# Patient Record
Sex: Female | Born: 1970 | Race: White | Hispanic: No | Marital: Married | Smoking: Never smoker
Health system: Southern US, Community
[De-identification: ages and names within clinical notes are randomized; demographics above are authoritative.]

## PROBLEM LIST (undated history)

## (undated) DIAGNOSIS — K589 Irritable bowel syndrome without diarrhea: Secondary | ICD-10-CM

## (undated) DIAGNOSIS — K219 Gastro-esophageal reflux disease without esophagitis: Secondary | ICD-10-CM

## (undated) DIAGNOSIS — G43909 Migraine, unspecified, not intractable, without status migrainosus: Secondary | ICD-10-CM

## (undated) DIAGNOSIS — C801 Malignant (primary) neoplasm, unspecified: Secondary | ICD-10-CM

## (undated) HISTORY — PX: CHOLECYSTECTOMY: SHX55

---

## 2008-03-26 ENCOUNTER — Ambulatory Visit: Payer: Self-pay | Admitting: Emergency Medicine

## 2008-08-15 ENCOUNTER — Ambulatory Visit: Payer: Self-pay | Admitting: Internal Medicine

## 2009-10-30 ENCOUNTER — Ambulatory Visit: Payer: Self-pay | Admitting: Internal Medicine

## 2010-10-05 ENCOUNTER — Ambulatory Visit: Payer: Self-pay | Admitting: Internal Medicine

## 2010-11-03 ENCOUNTER — Ambulatory Visit: Payer: Self-pay

## 2011-12-31 ENCOUNTER — Ambulatory Visit: Payer: Self-pay | Admitting: Family Medicine

## 2013-01-31 ENCOUNTER — Ambulatory Visit: Payer: Self-pay | Admitting: Family Medicine

## 2013-03-22 ENCOUNTER — Ambulatory Visit: Payer: Self-pay

## 2013-12-15 ENCOUNTER — Ambulatory Visit: Payer: Self-pay | Admitting: Emergency Medicine

## 2014-08-29 ENCOUNTER — Other Ambulatory Visit: Payer: Self-pay

## 2014-08-29 ENCOUNTER — Encounter: Payer: Self-pay | Admitting: *Deleted

## 2014-08-29 ENCOUNTER — Ambulatory Visit
Admission: EM | Admit: 2014-08-29 | Discharge: 2014-08-29 | Disposition: A | Discharge status: Home / Self Care | Payer: Federal, State, Local not specified - PPO | Attending: Internal Medicine | Admitting: Internal Medicine

## 2014-08-29 DIAGNOSIS — R079 Chest pain, unspecified: Secondary | ICD-10-CM | POA: Diagnosis present

## 2014-08-29 DIAGNOSIS — R0789 Other chest pain: Secondary | ICD-10-CM

## 2014-08-29 DIAGNOSIS — R110 Nausea: Secondary | ICD-10-CM | POA: Diagnosis present

## 2014-08-29 DIAGNOSIS — K219 Gastro-esophageal reflux disease without esophagitis: Secondary | ICD-10-CM | POA: Insufficient documentation

## 2014-08-29 DIAGNOSIS — R42 Dizziness and giddiness: Secondary | ICD-10-CM | POA: Diagnosis present

## 2014-08-29 HISTORY — DX: Gastro-esophageal reflux disease without esophagitis: K21.9

## 2014-08-29 NOTE — Discharge Instructions (Signed)

## 2014-08-29 NOTE — ED Notes (Signed)
Pt states "having chest pressure on the right side of my chest that started about 2-3 days ago, wakes me up at night. Nausea comes and goes, feels better after i eat, I have dizziness with the nausea"

## 2014-08-30 NOTE — ED Provider Notes (Signed)
CSN: [id number]     Arrival date & time 08/29/14  1803 History   First MD Initiated Contact with Patient 08/29/14 1843     Chief Complaint  Patient presents with  . Chest Pain  . Nausea  . Dizziness   HPI   Pt is a 44 yo lady with benign past medical hx who presents with several episodes of nocturnal R upper chest pressure/tightness recently.  No chest pain at urgent care.  Not exertional; improves if able to go walk/run.  No HTN, no DM, no hyperlipidemia.  Never smoker.  Under some stress, sold house and building new one, living in small apartment temporarily with family awaiting new home, for last 6 weeks.  No leg pain/swelling, not taking hormones.    Past Medical History  Diagnosis Date  . GERD (gastroesophageal reflux disease)    History reviewed. No pertinent past surgical history. No family history on file. History  Substance Use Topics  . Smoking status: Never Smoker   . Smokeless tobacco: Not on file  . Alcohol Use: 2.4 oz/week    4 Glasses of wine per week   OB History    No data available     Review of Systems  All other systems reviewed and are negative.   Allergies  Penicillins  Home Medications   Prior to Admission medications   Medication Sig Start Date End Date Taking? Authorizing Provider  omeprazole (PRILOSEC) 20 MG capsule Take 20 mg by mouth daily.   Yes Historical Provider, MD  zolpidem (AMBIEN) 5 MG tablet Take 5 mg by mouth at bedtime as needed for sleep.   Yes Historical Provider, MD   Temp(Src) 98.3 F (36.8 C) (Tympanic)  Ht 5\' 5"  (1.651 m)  Wt 176 lb (79.833 kg)  BMI 29.29 kg/m2 Physical Exam  Constitutional: She is oriented to person, place, and time. No distress.  Alert, nicely groomed  HENT:  Head: Atraumatic.  Eyes:  Conjugate gaze, no eye redness/drainage  Neck: Neck supple.  Cardiovascular: Normal rate and regular rhythm.   Pulmonary/Chest: No respiratory distress.  Lungs clear, symmetric breath sounds  Abdominal: She  exhibits no distension.  Musculoskeletal: Normal range of motion.  No leg swelling  Neurological: She is alert and oriented to person, place, and time.  Skin: Skin is warm and dry.  No cyanosis  Nursing note and vitals reviewed.   ED Course  Procedures   ED ECG REPORT   Date: 08/30/2014  EKG Time: 10:59 PM  Rate:63  Rhythm: normal sinus rhythm,  normal EKG, normal sinus rhythm, unchanged from previous tracings  Axis: 70  Intervals:none  ST&T Change: none  Narrative Interpretation: normal ECG  I reviewed the ECG done in the urgent care, and agree with the computer interpretation.  MDM   1. Atypical chest pain    Has appointment with pcp/Dr Bernadene Person on 7/1.  Followup as planned, to discuss any further evaluation needed.  Might benefit from increasing prilosec to 40mg  qday.  Pattern of pain (non exertional) and absence of significant risk factors suggest benign source of discomfort.  To ED for severe/persistent pain, or exertional pain.    Sherlene Shams, MD 08/30/14 [phone]

## 2014-08-31 ENCOUNTER — Other Ambulatory Visit: Payer: Self-pay | Admitting: Family Medicine

## 2014-08-31 DIAGNOSIS — Z1231 Encounter for screening mammogram for malignant neoplasm of breast: Secondary | ICD-10-CM

## 2014-09-05 ENCOUNTER — Ambulatory Visit
Admission: RE | Admit: 2014-09-05 | Discharge: 2014-09-05 | Disposition: A | Discharge status: Home / Self Care | Payer: Federal, State, Local not specified - PPO | Source: Ambulatory Visit | Attending: Family Medicine | Admitting: Family Medicine

## 2014-09-05 DIAGNOSIS — Z1231 Encounter for screening mammogram for malignant neoplasm of breast: Secondary | ICD-10-CM | POA: Diagnosis not present

## 2014-10-30 ENCOUNTER — Ambulatory Visit
Admission: EM | Admit: 2014-10-30 | Discharge: 2014-10-30 | Disposition: A | Discharge status: Home / Self Care | Payer: Federal, State, Local not specified - PPO | Attending: Family Medicine | Admitting: Family Medicine

## 2014-10-30 ENCOUNTER — Other Ambulatory Visit: Payer: Self-pay

## 2014-10-30 ENCOUNTER — Ambulatory Visit: Payer: Federal, State, Local not specified - PPO

## 2014-10-30 DIAGNOSIS — R079 Chest pain, unspecified: Secondary | ICD-10-CM | POA: Diagnosis present

## 2014-10-30 DIAGNOSIS — R0789 Other chest pain: Secondary | ICD-10-CM | POA: Insufficient documentation

## 2014-10-30 DIAGNOSIS — M549 Dorsalgia, unspecified: Secondary | ICD-10-CM | POA: Diagnosis present

## 2014-10-30 DIAGNOSIS — M791 Myalgia, unspecified site: Secondary | ICD-10-CM

## 2014-10-30 HISTORY — DX: Migraine, unspecified, not intractable, without status migrainosus: G43.909

## 2014-10-30 HISTORY — DX: Irritable bowel syndrome, unspecified: K58.9

## 2014-10-30 LAB — FIBRIN DERIVATIVES D-DIMER (ARMC ONLY): Fibrin derivatives D-dimer (ARMC): 185 (ref 0–499)

## 2014-10-30 LAB — CBC WITH DIFFERENTIAL/PLATELET
BASOS ABS: 0.1 10*3/uL (ref 0–0.1)
BASOS PCT: 1 %
EOS ABS: 0.1 10*3/uL (ref 0–0.7)
Eosinophils Relative: 1 %
HCT: 39.6 % (ref 35.0–47.0)
HEMOGLOBIN: 13.5 g/dL (ref 12.0–16.0)
Lymphocytes Relative: 28 %
Lymphs Abs: 2.1 10*3/uL (ref 1.0–3.6)
MCH: 30.8 pg (ref 26.0–34.0)
MCHC: 34 g/dL (ref 32.0–36.0)
MCV: 90.4 fL (ref 80.0–100.0)
Monocytes Absolute: 0.4 10*3/uL (ref 0.2–0.9)
Monocytes Relative: 6 %
NEUTROS PCT: 64 %
Neutro Abs: 5 10*3/uL (ref 1.4–6.5)
Platelets: 234 10*3/uL (ref 150–440)
RBC: 4.38 MIL/uL (ref 3.80–5.20)
RDW: 12.9 % (ref 11.5–14.5)
WBC: 7.7 10*3/uL (ref 3.6–11.0)

## 2014-10-30 LAB — RAPID STREP SCREEN (MED CTR MEBANE ONLY): Streptococcus, Group A Screen (Direct): NEGATIVE

## 2014-10-30 LAB — COMPREHENSIVE METABOLIC PANEL
ALBUMIN: 4.5 g/dL (ref 3.5–5.0)
ALK PHOS: 57 U/L (ref 38–126)
ALT: 11 U/L — ABNORMAL LOW (ref 14–54)
ANION GAP: 9 (ref 5–15)
AST: 13 U/L — ABNORMAL LOW (ref 15–41)
BUN: 11 mg/dL (ref 6–20)
CALCIUM: 9.5 mg/dL (ref 8.9–10.3)
CO2: 26 mmol/L (ref 22–32)
Chloride: 102 mmol/L (ref 101–111)
Creatinine, Ser: 0.94 mg/dL (ref 0.44–1.00)
GFR calc Af Amer: >60 mL/min (ref >60)
GFR calc non Af Amer: >60 mL/min (ref >60)
GLUCOSE: 94 mg/dL (ref 65–99)
Potassium: 3.7 mmol/L (ref 3.5–5.1)
SODIUM: 137 mmol/L (ref 135–145)
Total Bilirubin: 0.8 mg/dL (ref 0.3–1.2)
Total Protein: 7.6 g/dL (ref 6.5–8.1)

## 2014-10-30 LAB — URINALYSIS COMPLETE WITH MICROSCOPIC (ARMC ONLY)
BACTERIA UA: NONE SEEN — AB
Bilirubin Urine: NEGATIVE
Glucose, UA: NEGATIVE mg/dL
HGB URINE DIPSTICK: NEGATIVE
LEUKOCYTES UA: NEGATIVE
NITRITE: NEGATIVE
PH: 7.5 (ref 5.0–8.0)
PROTEIN: NEGATIVE mg/dL
SPECIFIC GRAVITY, URINE: 1.02 (ref 1.005–1.030)

## 2014-10-30 LAB — TROPONIN I

## 2014-10-30 LAB — CKMB (ARMC ONLY): CK, MB: 0.9 ng/mL (ref 0.5–5.0)

## 2014-10-30 LAB — CK: Total CK: 74 U/L (ref 38–234)

## 2014-10-30 MED ORDER — KETOROLAC TROMETHAMINE 60 MG/2ML IM SOLN
60.0000 mg | Freq: Once | INTRAMUSCULAR | Status: AC
  Administered 2014-10-30: 60 mg via INTRAMUSCULAR

## 2014-10-30 MED ORDER — MELOXICAM 15 MG PO TABS: 15.0000 mg | ORAL_TABLET | Freq: Every day | ORAL | Status: DC

## 2014-10-30 NOTE — Discharge Instructions (Signed)
Chest Pain (Nonspecific) It is often hard to give a diagnosis for the cause of chest pain. There is always a chance that your pain could be related to something serious, such as a heart attack or a blood clot in the lungs. You need to follow up with your doctor. HOME CARE  If antibiotic medicine was given, take it as directed by your doctor. Finish the medicine even if you start to feel better.  For the next few days, avoid activities that bring on chest pain. Continue physical activities as told by your doctor.  Do not use any tobacco products. This includes cigarettes, chewing tobacco, and e-cigarettes.  Avoid drinking alcohol.  Only take medicine as told by your doctor.  Follow your doctor's suggestions for more testing if your chest pain does not go away.  Keep all doctor visits you made. GET HELP IF:  Your chest pain does not go away, even after treatment.  You have a rash with blisters on your chest.  You have a fever. GET HELP RIGHT AWAY IF:   You have more pain or pain that spreads to your arm, neck, jaw, back, or belly (abdomen).  You have shortness of breath.  You cough more than usual or cough up blood.  You have very bad back or belly pain.  You feel sick to your stomach (nauseous) or throw up (vomit).  You have very bad weakness.  You pass out (faint).  You have chills. This is an emergency. Do not wait to see if the problems will go away. Call your local emergency services (911 in [place] Do not drive yourself to the hospital. MAKE SURE YOU:   Understand these instructions.  Will watch your condition.  Will get help right away if you are not doing well or get worse. Document Released: 08/05/2007 Document Revised: 02/21/2013 Document Reviewed: 08/05/2007 [facility] Patient Information 2015 [place], [place]. This information is not intended to replace advice given to you by your health care provider. Make sure you discuss any questions you have with your  health care provider.  Pain of Unknown Etiology (Pain Without a Known Cause) You have come to your caregiver because of pain. Pain can occur in any part of the body. Often there is not a definite cause. If your laboratory (blood or urine) work was normal and X-rays or other studies were normal, your caregiver may treat you without knowing the cause of the pain. An example of this is the headache. Most headaches are diagnosed by taking a history. This means your caregiver asks you questions about your headaches. Your caregiver determines a treatment based on your answers. Usually testing done for headaches is normal. Often testing is not done unless there is no response to medications. Regardless of where your pain is located today, you can be given medications to make you comfortable. If no physical cause of pain can be found, most cases of pain will gradually leave as suddenly as they came.  If you have a painful condition and no reason can be found for the pain, it is important that you follow up with your caregiver. If the pain becomes worse or does not go away, it may be necessary to repeat tests and look further for a possible cause.  Only take over-the-counter or prescription medicines for pain, discomfort, or fever as directed by your caregiver.  For the protection of your privacy, test results cannot be given over the phone. Make sure you receive the results of your test.  Ask how these results are to be obtained if you have not been informed. It is your responsibility to obtain your test results.  You may continue all activities unless the activities cause more pain. When the pain lessens, it is important to gradually resume normal activities. Resume activities by beginning slowly and gradually increasing the intensity and duration of the activities or exercise. During periods of severe pain, bed rest may be helpful. Lie or sit in any position that is comfortable.  Ice used for acute (sudden)  conditions may be effective. Use a large plastic bag filled with ice and wrapped in a towel. This may provide pain relief.  See your caregiver for continued problems. Your caregiver can help or refer you for exercises or physical therapy if necessary. If you were given medications for your condition, do not drive, operate machinery or power tools, or sign legal documents for 24 hours. Do not drink alcohol, take sleeping pills, or take other medications that may interfere with treatment. See your caregiver immediately if you have pain that is becoming worse and not relieved by medications. Document Released: 11/11/2000 Document Revised: 12/07/2012 Document Reviewed: 02/16/2005 [facility] Patient Information 2015 [place], [place]. This information is not intended to replace advice given to you by your health care provider. Make sure you discuss any questions you have with your health care provider.
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
## 2014-10-30 NOTE — ED Provider Notes (Signed)
CSN: [id number]     Arrival date & time 10/30/14  1430 History   First MD Initiated Contact with Patient 10/30/14 1540     Chief Complaint  Patient presents with  . Back Pain   patient's here for back pain but also chest pain. She states that she's had a stress test for the last 2 months because of atypical chest pain. She saw the gastrologist and had endoscopy yesterday she also chiropractor now she has achiness in her chest all over and the back pain. She talked to the GI fellow who want to make sure that she didn't have any cardiac problems going on and she came in to be seen and evaluated. Patient has a quite extensive history of this atypical pain and discomfort in her chest and her back has been undergoing several tests with her PCP gastrologist and other health modalities. Also patient's low-grade fever but no specific or focal complaint. (Consider location/radiation/quality/duration/timing/severity/associated sxs/prior Treatment) Patient is a 44 y.o. female presenting with back pain. The history is provided by the patient and the spouse. No language interpreter was used.  Back Pain Location:  Thoracic spine Quality:  Stiffness Stiffness is present:  All day Radiates to: The chest. Pain severity:  Moderate Onset quality:  Sudden Timing:  Constant Progression:  Unchanged Context: recent illness   Context comment:  Patient had endoscopy yesterday and followed by spinal manipulation of the cervical spine and the thoracic spine Relieved by:  Nothing Worsened by:  Nothing tried Associated symptoms: chest pain and fever   Associated symptoms: no headaches and no numbness   Chest pain:    Quality:  Aching and dull   Severity:  Moderate   Progression:  Worsening   Chronicity:  Recurrent Risk factors: recent surgery   Risk factors: no hx of cancer, no hx of osteoporosis, no lack of exercise, no menopause, not obese and not pregnant   Risk factors comment:  Recently had upper  endoscopy   Past Medical History  Diagnosis Date  . GERD (gastroesophageal reflux disease)   . GERD (gastroesophageal reflux disease)   . Migraines   . IBS (irritable bowel syndrome)    Past Surgical History  Procedure Laterality Date  . Cholecystectomy     mother died of a massive Family History  Problem Relation Age of Onset  . Heart attack Mother    mother actually had a cardiogenic shock after having surgery and going in, her grandmother did have straight coronary artery disease Social History  Substance Use Topics  . Smoking status: Never Smoker   . Smokeless tobacco: None  . Alcohol Use: 2.4 oz/week    4 Glasses of wine per week     Comment: socially   OB History    No data available     Review of Systems  Constitutional: Positive for fever.  HENT: Positive for congestion, rhinorrhea and sore throat.   Cardiovascular: Positive for chest pain.  Genitourinary: Negative.   Musculoskeletal: Positive for myalgias and back pain.  Skin: Negative.   Neurological: Negative for speech difficulty, numbness and headaches.  All other systems reviewed and are negative.   Allergies  Imitrex and Penicillins  Home Medications   Prior to Admission medications   Medication Sig Start Date End Date Taking? Authorizing Provider  cyclobenzaprine (FLEXERIL) 10 MG tablet Take 10 mg by mouth 3 (three) times daily as needed for muscle spasms.   Yes Historical Provider, MD  desipramine (NORPRAMIN) 50 MG tablet Take 50 mg  by mouth daily.   Yes Historical Provider, MD  pantoprazole (PROTONIX) 20 MG tablet Take 20 mg by mouth 2 (two) times daily.   Yes Historical Provider, MD  verapamil (COVERA HS) 180 MG (CO) 24 hr tablet Take 180 mg by mouth at bedtime.   Yes Historical Provider, MD  zolpidem (AMBIEN) 5 MG tablet Take 5 mg by mouth at bedtime as needed for sleep.   Yes Historical Provider, MD  meloxicam (MOBIC) 15 MG tablet Take 1 tablet (15 mg total) by mouth daily. 10/30/14   Frederich Cha, MD  omeprazole (PRILOSEC) 20 MG capsule Take 20 mg by mouth daily.    Historical Provider, MD   Meds Ordered and Administered this Visit   Medications  ketorolac (TORADOL) injection 60 mg (60 mg Intramuscular Given 10/30/14 1615)    BP 123/75 mmHg  Pulse 66  Temp(Src) 98.6 F (37 C) (Tympanic)  Resp 16  SpO2 100% No data found.   Physical Exam  Constitutional: She is oriented to person, place, and time. She appears well-developed and well-nourished. No distress.  HENT:  Head: Normocephalic and atraumatic.  Right Ear: External ear normal.  Eyes: Conjunctivae are normal. Pupils are equal, round, and reactive to light.  Neck: Normal range of motion. Neck supple. No tracheal deviation present. No thyromegaly present.  Cardiovascular: Normal rate, regular rhythm, S1 normal, S2 normal and normal heart sounds.   Pulmonary/Chest: Effort normal and breath sounds normal. No accessory muscle usage. No respiratory distress. She has no decreased breath sounds. She has no wheezes. Chest wall is not dull to percussion. She exhibits no tenderness, no bony tenderness and no deformity.  Abdominal: Soft. Bowel sounds are normal.  Musculoskeletal: Normal range of motion. She exhibits no edema or tenderness.  Neurological: She is alert and oriented to person, place, and time. No cranial nerve deficit.  Skin: Skin is warm and dry. She is not diaphoretic. No erythema.  Psychiatric: She has a normal mood and affect. Her behavior is normal.  Vitals reviewed.   ED Course  Procedures (including critical care time)  Labs Review Labs Reviewed  COMPREHENSIVE METABOLIC PANEL - Abnormal; Notable for the following:    AST 13 (*)    ALT 11 (*)    All other components within normal limits  URINALYSIS COMPLETEWITH MICROSCOPIC ([facility] ONLY) - Abnormal; Notable for the following:    APPearance HAZY (*)    Ketones, ur 1+ (*)    Bacteria, UA NONE SEEN (*)    Squamous Epithelial / LPF 0-5 (*)    All  other components within normal limits  RAPID STREP SCREEN (NOT AT [facility])  CULTURE, GROUP A STREP ([facility] ONLY)  CBC WITH DIFFERENTIAL/PLATELET  TROPONIN I  CK  FIBRIN DERIVATIVES D-DIMER ([facility] ONLY)  CKMB([facility] ONLY)    Imaging Review Dg Chest 2 View  10/30/2014   CLINICAL DATA:  Centralized chest pain for 2 months. Worsening today. Endoscopy yesterday.  EXAM: CHEST  2 VIEW  COMPARISON:  10/05/2010  FINDINGS: The heart size and mediastinal contours are within normal limits. Both lungs are clear. The visualized skeletal structures are unremarkable.  IMPRESSION: No active cardiopulmonary disease.   Electronically Signed   By: Rolm Baptise M.D.   On: 10/30/2014 15:33     Visual Acuity Review  Right Eye Distance:   Left Eye Distance:   Bilateral Distance:    Right Eye Near:   Left Eye Near:    Bilateral Near:     EKG showed normal  sinus rhythm.    MDM   1. Atypical chest pain   2. Myalgia    Explained earlier in my main goal was make sure there was nothing life-threatening. In concern because of the mild elevated fever but this all can be viral. Lab work since she came back negative will haziness the urine but no bacteria and no leukocytes were seen. Apparently Toradol injection didn't help much. Will give sufficient for Mobic 7. with her doctor later this week, and follow-up with her PCP.Marland Kitchen THE lab work x-rays are given to them as well. Husband expresses concern that they need to head home so will allow nurse discharge patient with a prescription for Mobic.   Frederich Cha, MD 10/30/14 [phone]

## 2014-11-02 LAB — CULTURE, GROUP A STREP (THRC)

## 2014-11-06 ENCOUNTER — Other Ambulatory Visit: Payer: Self-pay | Admitting: Family Medicine

## 2014-11-06 DIAGNOSIS — R0602 Shortness of breath: Secondary | ICD-10-CM

## 2014-11-06 DIAGNOSIS — R0789 Other chest pain: Secondary | ICD-10-CM

## 2014-11-09 ENCOUNTER — Ambulatory Visit
Admission: RE | Admit: 2014-11-09 | Discharge: 2014-11-09 | Disposition: A | Discharge status: Home / Self Care | Payer: Federal, State, Local not specified - PPO | Source: Ambulatory Visit | Attending: Family Medicine | Admitting: Family Medicine

## 2014-11-09 DIAGNOSIS — R0789 Other chest pain: Secondary | ICD-10-CM | POA: Diagnosis present

## 2014-11-09 DIAGNOSIS — R0602 Shortness of breath: Secondary | ICD-10-CM | POA: Insufficient documentation

## 2014-11-09 MED ORDER — IOHEXOL 350 MG/ML SOLN
100.0000 mL | Freq: Once | INTRAVENOUS | Status: AC | PRN
  Administered 2014-11-09: 100 mL via INTRAVENOUS

## 2015-09-06 ENCOUNTER — Ambulatory Visit
Admission: RE | Admit: 2015-09-06 | Discharge: 2015-09-06 | Disposition: A | Discharge status: Home / Self Care | Payer: Federal, State, Local not specified - PPO | Source: Ambulatory Visit | Attending: Family Medicine | Admitting: Family Medicine

## 2015-09-06 ENCOUNTER — Other Ambulatory Visit: Payer: Self-pay | Admitting: Family Medicine

## 2015-09-06 DIAGNOSIS — R52 Pain, unspecified: Secondary | ICD-10-CM

## 2015-09-06 DIAGNOSIS — M79672 Pain in left foot: Secondary | ICD-10-CM | POA: Diagnosis present

## 2015-10-22 ENCOUNTER — Other Ambulatory Visit: Payer: Self-pay | Admitting: Family Medicine

## 2015-10-22 DIAGNOSIS — Z1231 Encounter for screening mammogram for malignant neoplasm of breast: Secondary | ICD-10-CM

## 2015-11-19 ENCOUNTER — Other Ambulatory Visit: Payer: Self-pay | Admitting: Family Medicine

## 2015-11-19 ENCOUNTER — Ambulatory Visit
Admission: RE | Admit: 2015-11-19 | Discharge: 2015-11-19 | Disposition: A | Discharge status: Home / Self Care | Payer: Federal, State, Local not specified - PPO | Source: Ambulatory Visit | Attending: Family Medicine | Admitting: Family Medicine

## 2015-11-19 DIAGNOSIS — Z1231 Encounter for screening mammogram for malignant neoplasm of breast: Secondary | ICD-10-CM | POA: Insufficient documentation

## 2016-03-11 ENCOUNTER — Other Ambulatory Visit: Payer: Self-pay | Admitting: Internal Medicine

## 2016-03-11 ENCOUNTER — Ambulatory Visit
Admission: RE | Admit: 2016-03-11 | Discharge: 2016-03-11 | Disposition: A | Discharge status: Home / Self Care | Payer: Self-pay | Source: Ambulatory Visit | Attending: Internal Medicine | Admitting: Internal Medicine

## 2016-03-11 DIAGNOSIS — M542 Cervicalgia: Secondary | ICD-10-CM

## 2016-03-11 DIAGNOSIS — M47892 Other spondylosis, cervical region: Secondary | ICD-10-CM | POA: Insufficient documentation

## 2017-01-06 ENCOUNTER — Other Ambulatory Visit: Payer: Self-pay | Admitting: Medical Oncology

## 2017-01-06 ENCOUNTER — Ambulatory Visit
Admission: RE | Admit: 2017-01-06 | Discharge: 2017-01-06 | Disposition: A | Discharge status: Home / Self Care | Payer: Federal, State, Local not specified - PPO | Source: Ambulatory Visit | Attending: Medical Oncology | Admitting: Medical Oncology

## 2017-01-06 DIAGNOSIS — M79661 Pain in right lower leg: Secondary | ICD-10-CM | POA: Diagnosis present

## 2017-05-29 ENCOUNTER — Other Ambulatory Visit: Payer: Self-pay

## 2017-05-29 ENCOUNTER — Ambulatory Visit
Admission: EM | Admit: 2017-05-29 | Discharge: 2017-05-29 | Disposition: A | Discharge status: Home / Self Care | Payer: Federal, State, Local not specified - PPO | Attending: Emergency Medicine | Admitting: Emergency Medicine

## 2017-05-29 DIAGNOSIS — R197 Diarrhea, unspecified: Secondary | ICD-10-CM

## 2017-05-29 DIAGNOSIS — K2900 Acute gastritis without bleeding: Secondary | ICD-10-CM

## 2017-05-29 DIAGNOSIS — R110 Nausea: Secondary | ICD-10-CM

## 2017-05-29 MED ORDER — ONDANSETRON 8 MG PO TBDP: 8.0000 mg | ORAL_TABLET | Freq: Two times a day (BID) | ORAL | 0 refills | Status: AC

## 2017-05-29 NOTE — ED Triage Notes (Signed)
Patient complains of abdominal pain in upper abdomen with nausea. Patient states that she has been seeing a GI Specialist for this. Patient reports that symptoms worsened last night, with nausea getting stronger.

## 2017-05-29 NOTE — ED Provider Notes (Signed)
[facility]    CSN: [id number] Arrival date & time: 05/29/17  1232     History   Chief Complaint Chief Complaint  Patient presents with  . Abdominal Pain    HPI Joanne Bender is a 47 y.o. female.   HPI  47 year old female presents with abdominal pain which indicates left lower quadrant upper quadrant nausea but no vomiting.  She has had diarrhea that has been runny but without blood or mucus.  She has been seeing a GI specialist for possible autoimmune issues causing her stomach problems.  She does have a history of IBS.  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  States that last night was the worst she has had in a while with pain in the abdomen nausea but no vomiting.  She has lost her appetite.  She has eaten only lactose-free milk bananas and rice.  Had no fever or chills.         Past Medical History:  Diagnosis Date  . GERD (gastroesophageal reflux disease)   . GERD (gastroesophageal reflux disease)   . IBS (irritable bowel syndrome)   . Migraines     There are no active problems to display for this patient.   Past Surgical History:  Procedure Laterality Date  . CHOLECYSTECTOMY      OB History   None      Home Medications    Prior to Admission medications   Medication Sig Start Date End Date Taking? Authorizing Provider  Ascorbic Acid (VITAMIN C) 1000 MG tablet Take by mouth.   Yes [provider]  azelastine (ASTELIN) 0.1 % nasal spray Place into the nose. 05/21/17  Yes [provider]  Cholecalciferol (VITAMIN D) 2000 units tablet Take by mouth.   Yes [provider]  Coenzyme Q10 (CO Q 10) 10 MG CAPS Take by mouth.   Yes [provider]  diazepam (VALIUM) 5 MG tablet diazepam 5 mg tablet  TAKE 1 TABLET BY MOUTH EVERY DAY AT NIGHT 05/21/17  Yes [provider]  Diclofenac Sodium (PENNSAID) 2 %  SOLN Pennsaid 20 mg/gram/actuation (2 %) topical soln in metered-dose pump  APPLY 2 PUMPS (40 MG) TO THE AFFECTED KNEE(S) BY TOPICAL ROUTE 2 TIMES PER DAY   Yes [provider]  DOCOSAHEXAENOIC ACID PO Take by mouth.   Yes [provider]  fluticasone (FLONASE) 50 MCG/ACT nasal spray daily as needed. Frequency:PHARMDIR   Dosage:50   MCG  Instructions:  Note:Two puffs in each nostril once a day for seasonal allergies. Dose: 50MCG 06/19/11  Yes [provider]  folic acid (FOLVITE) 970 MCG tablet Take by mouth. 04/09/17 04/09/18 Yes [provider]  gabapentin (NEURONTIN) 100 MG capsule gabapentin 100 mg capsule  TAKE 1-3 CAPSULES BY MOUTH THREE TIMES DAILY   Yes [provider]  HYDROcodone-acetaminophen (NORCO) 5-325 MG tablet Norco 5 mg-325 mg tablet  Take 1 tablet 3 times a day by oral route as needed for 30 days.   Yes [provider]  levonorgestrel (MIRENA, 52 MG,) 20 MCG/24HR IUD by Intrauterine route.   Yes [provider]  Magnesium 200 MG TABS Use 1,250 mg 3 (three) times daily. Reported on 02/13/2015   Yes [provider]  naloxone (NARCAN) nasal spray 4 mg/0.1 mL Narcan 4 mg/actuation nasal spray   Yes [provider]  pimecrolimus (ELIDEL) 1 % cream 1  APPLICATION APPLY ON THE SKIN AT BEDTIME PRN 03/10/16  Yes [provider]  ranitidine (ZANTAC) 150 MG tablet  05/27/17  Yes [provider]  sulfaSALAzine (AZULFIDINE) 500 MG EC tablet sulfasalazine 500 mg tablet,delayed release  TAKE 2 TABLETS BY MOUTH TWICE A DAY   Yes [provider]  traZODone (DESYREL) 50 MG tablet Take by mouth. 05/21/17  Yes [provider]  ondansetron (ZOFRAN ODT) 8 MG disintegrating tablet Take 1 tablet (8 mg total) by mouth 2 (two) times daily. 05/29/17   Lorin Picket, PA-C    Family History Family History  Problem Relation Age of Onset  . Heart attack Mother     Social History Social History    Tobacco Use  . Smoking status: Never Smoker  . Smokeless tobacco: Never Used  Substance Use Topics  . Alcohol use: Not Currently  . Drug use: No     Allergies   Beta vulgaris and Imitrex [sumatriptan]   Review of Systems Review of Systems   Physical Exam Triage Vital Signs ED Triage Vitals  Enc Vitals Group     BP 05/29/17 1256 116/69     Pulse Rate 05/29/17 1256 87     Resp 05/29/17 1256 18     Temp 05/29/17 1256 98 F (36.7 C)     Temp Source 05/29/17 1256 Oral     SpO2 05/29/17 1256 100 %     Weight 05/29/17 1249 165 lb (74.8 kg)     Height 05/29/17 1249 5\' 5"  (1.651 m)     Head Circumference --      Peak Flow --      Pain Score 05/29/17 1248 6     Pain Loc --      Pain Edu? --      Excl. in [place]? --    No data found.  Updated Vital Signs BP 116/69 (BP Location: Right Arm)   Pulse 87   Temp 98 F (36.7 C) (Oral)   Resp 18   Ht 5\' 5"  (1.651 m)   Wt 165 lb (74.8 kg)   SpO2 100%   BMI 27.46 kg/m   Visual Acuity Right Eye Distance:   Left Eye Distance:   Bilateral Distance:    Right Eye Near:   Left Eye Near:    Bilateral Near:     Physical Exam   UC Treatments / Results  Labs (all labs ordered are listed, but only abnormal results are displayed) Labs Reviewed - No data to display  EKG None Radiology No results found.  Procedures Procedures (including critical care time)  Medications Ordered in UC Medications - No data to display   Initial Impression / Assessment and Plan / UC Course  I have reviewed the triage vital signs and the nursing notes.  Pertinent labs & imaging results that were available during my care of the patient were reviewed by me and considered in my medical decision making (see chart for details).     Plan: 1. Test/x-ray results and diagnosis reviewed with patient 2. rx as per orders; risks, benefits, potential side effects reviewed with patient 3. Recommend supportive treatment with hydration.  Assured that  the physical exam does not show an acute abdomen.  Because of the amount of GI- type flu in the area that we have been seeing, this is likely what is going on; but her other problems of auto immune disease,with the onset of this viral gastroenteritis,  if it  Worsens, with increasing  abdominal pain vomiting that is intractable,etc., have recommended that she goes immediately to the emergency room.  She does improve then I recommend she follow-up with her primary care physician next week. 4. F/u prn if symptoms worsen or don't improve   Final Clinical Impressions(s) / UC Diagnoses   Final diagnoses:  Acute gastritis without hemorrhage, unspecified gastritis type  Nausea  Diarrhea of presumed infectious origin    ED Discharge Orders        Ordered    ondansetron (ZOFRAN ODT) 8 MG disintegrating tablet  2 times daily     05/29/17 1354       Controlled Substance Prescriptions [place] Controlled Substance Registry consulted? Not Applicable   Lorin Picket, PA-C 05/29/17 1406

## 2017-05-29 NOTE — Discharge Instructions (Signed)
Keep well-hydrated.  If your abdominal pain worsens or you have bloody diarrhea or any significant change in your symptoms recommend going to the emergency room

## 2017-11-09 ENCOUNTER — Other Ambulatory Visit: Payer: Self-pay | Admitting: Family Medicine

## 2017-11-09 DIAGNOSIS — Z1231 Encounter for screening mammogram for malignant neoplasm of breast: Secondary | ICD-10-CM

## 2017-11-18 ENCOUNTER — Ambulatory Visit
Admission: RE | Admit: 2017-11-18 | Discharge: 2017-11-18 | Disposition: A | Discharge status: Home / Self Care | Payer: Federal, State, Local not specified - PPO | Source: Ambulatory Visit | Attending: Family Medicine | Admitting: Family Medicine

## 2017-11-18 ENCOUNTER — Encounter (INDEPENDENT_AMBULATORY_CARE_PROVIDER_SITE_OTHER): Payer: Self-pay

## 2017-11-18 DIAGNOSIS — Z1231 Encounter for screening mammogram for malignant neoplasm of breast: Secondary | ICD-10-CM | POA: Insufficient documentation
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
## 2019-07-18 ENCOUNTER — Other Ambulatory Visit: Payer: Self-pay | Admitting: Family Medicine

## 2019-07-18 DIAGNOSIS — Z1231 Encounter for screening mammogram for malignant neoplasm of breast: Secondary | ICD-10-CM

## 2019-08-01 ENCOUNTER — Other Ambulatory Visit: Payer: Self-pay

## 2019-08-01 ENCOUNTER — Ambulatory Visit
Admission: RE | Admit: 2019-08-01 | Discharge: 2019-08-01 | Disposition: A | Discharge status: Home / Self Care | Payer: Federal, State, Local not specified - PPO | Source: Ambulatory Visit | Attending: Family Medicine | Admitting: Family Medicine

## 2019-08-01 DIAGNOSIS — Z1231 Encounter for screening mammogram for malignant neoplasm of breast: Secondary | ICD-10-CM | POA: Diagnosis present

## 2019-08-01 HISTORY — DX: Malignant (primary) neoplasm, unspecified: C80.1

## 2019-08-06 ENCOUNTER — Ambulatory Visit
Admission: EM | Admit: 2019-08-06 | Discharge: 2019-08-06 | Disposition: A | Discharge status: Home / Self Care | Payer: Federal, State, Local not specified - PPO | Attending: Family Medicine | Admitting: Family Medicine

## 2019-08-06 ENCOUNTER — Encounter: Payer: Self-pay | Admitting: Emergency Medicine

## 2019-08-06 ENCOUNTER — Other Ambulatory Visit: Payer: Self-pay

## 2019-08-06 DIAGNOSIS — M459 Ankylosing spondylitis of unspecified sites in spine: Secondary | ICD-10-CM | POA: Diagnosis present

## 2019-08-06 DIAGNOSIS — R0789 Other chest pain: Secondary | ICD-10-CM | POA: Diagnosis present

## 2019-08-06 LAB — TROPONIN I (HIGH SENSITIVITY): Troponin I (High Sensitivity): <2 ng/L (ref <18)

## 2019-08-06 NOTE — ED Triage Notes (Signed)
Patient states that she has chronic back pain.  Patient states that her chest pain has increased in its occurrence.  Patient reports some SOB.

## 2019-08-06 NOTE — ED Provider Notes (Signed)
[facility]    CSN: [id number] Arrival date & time: 08/06/19  1111      History   Chief Complaint Chief Complaint  Patient presents with  . Chest Pain    HPI Joanne Bender is a 49 y.o. female.   49 yo female with a c/o chest pains on and off for the past year but more persistent and frequent over the past month. States she felt it again this morning while packing her car. States not sure if this is due to her ankylosing spondylitis. States occasionally feels slightly short of breath. Denies any jaw pain, nausea, vomiting. States currently she's not having the pain.    Chest Pain   Past Medical History:  Diagnosis Date  . Cancer ([place])    skin ca  . GERD (gastroesophageal reflux disease)   . GERD (gastroesophageal reflux disease)   . IBS (irritable bowel syndrome)   . Migraines     There are no problems to display for this patient.   Past Surgical History:  Procedure Laterality Date  . CHOLECYSTECTOMY      OB History   No obstetric history on file.      Home Medications    Prior to Admission medications   Medication Sig Start Date End Date Taking? Authorizing Provider  Adalimumab (HUMIRA) 40 MG/0.8ML PSKT Humira 40 mg/0.8 mL subcutaneous syringe kit   Yes [provider]  albuterol (VENTOLIN HFA) 108 (90 Base) MCG/ACT inhaler SMARTSIG:2 Puff(s) By Mouth Every 6 Hours PRN 07/22/19  Yes [provider]  amitriptyline (ELAVIL) 10 MG tablet amitriptyline 10 mg tablet  Take 1 tablet every day by oral route.   Yes [provider]  azelastine (ASTELIN) 0.1 % nasal spray Place into the nose. 05/21/17  Yes [provider]  diazepam (VALIUM) 5 MG tablet Take 3 mg by mouth.  05/21/17  Yes [provider]  gabapentin (NEURONTIN) 100 MG capsule gabapentin 100 mg capsule  TAKE 1-3 CAPSULES BY MOUTH THREE TIMES DAILY   Yes [provider]  HYDROcodone-acetaminophen (NORCO) 5-325 MG tablet Norco 5 mg-325 mg  tablet  Take 1 tablet 3 times a day by oral route as needed for 30 days.   Yes [provider]  Hyoscyamine Sulfate SL 0.125 MG SUBL DISSOLVE 1-2 TABLETS UNDER THE TONGUE FOR ACUTE DIARRHEA AND CRAMPING 03/02/18  Yes [provider]  ipratropium (ATROVENT) 0.03 % nasal spray Place 2 sprays into both nostrils 2 (two) times daily. 06/28/19  Yes [provider]  levonorgestrel (MIRENA, 52 MG,) 20 MCG/24HR IUD by Intrauterine route.   Yes [provider]  montelukast (SINGULAIR) 10 MG tablet montelukast 10 mg tablet  Take 1 tablet every day by oral route. 03/31/19  Yes [provider]  Multiple Vitamin (MULTIVITAMIN) capsule Take by mouth.   Yes [provider]  omeprazole (PRILOSEC) 40 MG capsule Take by mouth. 07/04/19 07/03/20 Yes [provider]  ondansetron (ZOFRAN ODT) 8 MG disintegrating tablet Take 1 tablet (8 mg total) by mouth 2 (two) times daily. 05/29/17  Yes Crecencio Mc P, PA-C  RESTASIS 0.05 % ophthalmic emulsion 1 drop 2 (two) times daily. 07/22/19  Yes [provider]  traZODone (DESYREL) 50 MG tablet Take by mouth. 05/21/17  Yes [provider]  Ascorbic Acid (VITAMIN C) 1000 MG tablet Take by mouth.    [provider]  Cholecalciferol (VITAMIN D) 2000 units tablet Take by mouth.    [provider]  Coenzyme Q10 (  CO Q 10) 10 MG CAPS Take by mouth.    [provider]  Diclofenac Sodium (PENNSAID) 2 % SOLN Pennsaid 20 mg/gram/actuation (2 %) topical soln in metered-dose pump  APPLY 2 PUMPS (40 MG) TO THE AFFECTED KNEE(S) BY TOPICAL ROUTE 2 TIMES PER DAY    [provider]  DOCOSAHEXAENOIC ACID PO Take by mouth.    [provider]  fluticasone (FLONASE) 50 MCG/ACT nasal spray daily as needed. Frequency:PHARMDIR   Dosage:50   MCG  Instructions:  Note:Two puffs in each nostril once a day for seasonal allergies. Dose: 50MCG 06/19/11   [provider]  Magnesium 200  MG TABS Use 1,250 mg 3 (three) times daily. Reported on 02/13/2015    [provider]  naloxone [facility]) nasal spray 4 mg/0.1 mL Narcan 4 mg/actuation nasal spray    [provider]  pimecrolimus (ELIDEL) 1 % cream 1 APPLICATION APPLY ON THE SKIN AT BEDTIME PRN 03/10/16   [provider]  ranitidine (ZANTAC) 150 MG tablet  05/27/17   [provider]  sulfaSALAzine (AZULFIDINE) 500 MG EC tablet sulfasalazine 500 mg tablet,delayed release  TAKE 2 TABLETS BY MOUTH TWICE A DAY    [provider]    Family History Family History  Problem Relation Age of Onset  . Heart attack Mother   . Breast cancer Neg Hx     Social History Social History   Tobacco Use  . Smoking status: Never Smoker  . Smokeless tobacco: Never Used  Substance Use Topics  . Alcohol use: Not Currently  . Drug use: No     Allergies   Beta vulgaris and Imitrex [sumatriptan]   Review of Systems Review of Systems  Cardiovascular: Positive for chest pain.     Physical Exam Triage Vital Signs ED Triage Vitals  Enc Vitals Group     BP 08/06/19 1126 128/81     Pulse Rate 08/06/19 1126 88     Resp 08/06/19 1126 14     Temp 08/06/19 1126 98.3 F (36.8 C)     Temp Source 08/06/19 1126 Oral     SpO2 08/06/19 1126 100 %     Weight 08/06/19 1120 235 lb (106.6 kg)     Height 08/06/19 1120 '5\' 5"'  (1.651 m)     Head Circumference --      Peak Flow --      Pain Score 08/06/19 1120 5     Pain Loc --      Pain Edu? --      Excl. in [place]? --    No data found.  Updated Vital Signs BP 128/81 (BP Location: Right Arm)   Pulse 88   Temp 98.3 F (36.8 C) (Oral)   Resp 14   Ht '5\' 5"'  (1.651 m)   Wt 106.6 kg   SpO2 100%   BMI 39.11 kg/m   Visual Acuity Right Eye Distance:   Left Eye Distance:   Bilateral Distance:    Right Eye Near:   Left Eye Near:    Bilateral Near:     Physical Exam Vitals and nursing note reviewed.  Constitutional:      General: She is not in  acute distress.    Appearance: She is not toxic-appearing or diaphoretic.  Cardiovascular:     Rate and Rhythm: Normal rate and regular rhythm.     Heart sounds: Normal heart sounds.  Pulmonary:     Effort: Pulmonary effort is normal. No respiratory distress.  Breath sounds: Normal breath sounds. No stridor. No wheezing, rhonchi or rales.  Chest:     Chest wall: Tenderness (to palpation) present.  Neurological:     Mental Status: She is alert.      UC Treatments / Results  Labs (all labs ordered are listed, but only abnormal results are displayed) Labs Reviewed  TROPONIN I (HIGH SENSITIVITY)    EKG   Radiology No results found.  Procedures ED EKG  Date/Time: 08/06/2019 12:03 PM Performed by: Norval Gable, MD Authorized by: Norval Gable, MD   ECG reviewed by ED Physician in the absence of a cardiologist: yes   Previous ECG:    Previous ECG:  Compared to current   Similarity:  No change Interpretation:    Interpretation: normal   Rate:    ECG rate:  84   ECG rate assessment: normal   Rhythm:    Rhythm: sinus rhythm   Ectopy:    Ectopy: none   QRS:    QRS axis:  Normal   QRS intervals:  Normal Conduction:    Conduction: normal   ST segments:    ST segments:  Normal T waves:    T waves: normal     (including critical care time)  Medications Ordered in UC Medications - No data to display  Initial Impression / Assessment and Plan / UC Course  I have reviewed the triage vital signs and the nursing notes.  Pertinent labs & imaging results that were available during my care of the patient were reviewed by me and considered in my medical decision making (see chart for details).      Final Clinical Impressions(s) / UC Diagnoses   Final diagnoses:  Atypical chest pain  Ankylosing spondylitis, unspecified site of spine [facility])     Discharge Instructions     Continue current medications Follow up with Primary Care provider and  cardiologist    ED Prescriptions    None      1. Labs/ekg results (normal) and diagnosis reviewed with patient 2. Recommend continue current medications and follow up with PCP and cardiologist  3. Follow-up prn if symptoms worsen or don't improve   PDMP not reviewed this encounter.   Norval Gable, MD 08/06/19 1239

## 2019-08-06 NOTE — Discharge Instructions (Addendum)
Continue current medications Follow up with Primary Care provider and cardiologist

## 2020-06-28 ENCOUNTER — Other Ambulatory Visit: Payer: Self-pay | Admitting: Family Medicine

## 2020-06-28 DIAGNOSIS — Z1231 Encounter for screening mammogram for malignant neoplasm of breast: Secondary | ICD-10-CM

## 2020-08-01 ENCOUNTER — Other Ambulatory Visit: Payer: Self-pay

## 2020-08-01 ENCOUNTER — Ambulatory Visit
Admission: RE | Admit: 2020-08-01 | Discharge: 2020-08-01 | Disposition: A | Discharge status: Home / Self Care | Payer: Federal, State, Local not specified - PPO | Source: Ambulatory Visit | Attending: Family Medicine | Admitting: Family Medicine

## 2020-08-01 DIAGNOSIS — Z1231 Encounter for screening mammogram for malignant neoplasm of breast: Secondary | ICD-10-CM | POA: Diagnosis present

## 2021-07-04 ENCOUNTER — Other Ambulatory Visit: Payer: Self-pay | Admitting: Family Medicine

## 2021-07-04 DIAGNOSIS — Z1231 Encounter for screening mammogram for malignant neoplasm of breast: Secondary | ICD-10-CM

## 2021-08-18 ENCOUNTER — Ambulatory Visit
Admission: RE | Admit: 2021-08-18 | Discharge: 2021-08-18 | Disposition: A | Discharge status: Home / Self Care | Payer: Federal, State, Local not specified - PPO | Source: Ambulatory Visit | Attending: Family Medicine | Admitting: Family Medicine

## 2021-08-18 DIAGNOSIS — Z1231 Encounter for screening mammogram for malignant neoplasm of breast: Secondary | ICD-10-CM | POA: Diagnosis not present

## 2022-08-07 ENCOUNTER — Other Ambulatory Visit: Payer: Self-pay

## 2022-08-07 DIAGNOSIS — Z1231 Encounter for screening mammogram for malignant neoplasm of breast: Secondary | ICD-10-CM

## 2022-08-25 ENCOUNTER — Ambulatory Visit
Admission: RE | Admit: 2022-08-25 | Discharge: 2022-08-25 | Disposition: A | Discharge status: Home / Self Care | Payer: Federal, State, Local not specified - PPO | Source: Ambulatory Visit | Attending: Family Medicine | Admitting: Family Medicine

## 2022-08-25 DIAGNOSIS — Z1231 Encounter for screening mammogram for malignant neoplasm of breast: Secondary | ICD-10-CM | POA: Insufficient documentation

## 2023-08-03 ENCOUNTER — Other Ambulatory Visit: Payer: Self-pay | Admitting: Family Medicine

## 2023-08-03 DIAGNOSIS — Z1231 Encounter for screening mammogram for malignant neoplasm of breast: Secondary | ICD-10-CM

## 2023-08-27 ENCOUNTER — Ambulatory Visit
Admission: RE | Admit: 2023-08-27 | Discharge: 2023-08-27 | Disposition: A | Discharge status: Home / Self Care | Source: Ambulatory Visit | Attending: Family Medicine | Admitting: Family Medicine

## 2023-08-27 DIAGNOSIS — Z1231 Encounter for screening mammogram for malignant neoplasm of breast: Secondary | ICD-10-CM | POA: Diagnosis present
